# Patient Record
Sex: Female | Born: 1995 | Race: Black or African American | Hispanic: No | Marital: Single | Smoking: Never smoker
Health system: Southern US, Community
[De-identification: ages and names within clinical notes are randomized; demographics above are authoritative.]

## PROBLEM LIST (undated history)

## (undated) DIAGNOSIS — T7840XA Allergy, unspecified, initial encounter: Secondary | ICD-10-CM

## (undated) DIAGNOSIS — F419 Anxiety disorder, unspecified: Secondary | ICD-10-CM

## (undated) HISTORY — DX: Allergy, unspecified, initial encounter: T78.40XA

## (undated) HISTORY — PX: WISDOM TOOTH EXTRACTION: SHX21

## (undated) HISTORY — DX: Anxiety disorder, unspecified: F41.9

## (undated) HISTORY — PX: UTERINE FIBROID SURGERY: SHX826

## (undated) HISTORY — PX: TYMPANOSTOMY TUBE PLACEMENT: SHX32

---
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
## 2018-08-29 ENCOUNTER — Ambulatory Visit (HOSPITAL_COMMUNITY)
Admission: EM | Admit: 2018-08-29 | Discharge: 2018-08-29 | Disposition: A | Discharge status: Home / Self Care | Payer: PRIVATE HEALTH INSURANCE | Attending: Internal Medicine | Admitting: Internal Medicine

## 2018-08-29 ENCOUNTER — Encounter (HOSPITAL_COMMUNITY): Payer: Self-pay

## 2018-08-29 DIAGNOSIS — J019 Acute sinusitis, unspecified: Secondary | ICD-10-CM

## 2018-08-29 HISTORY — DX: Allergy, unspecified, initial encounter: T78.40XA

## 2018-08-29 MED ORDER — CETIRIZINE HCL 10 MG PO CAPS: 10.0000 mg | ORAL_CAPSULE | Freq: Every day | ORAL | 0 refills | Status: DC

## 2018-08-29 MED ORDER — NAPROXEN 500 MG PO TABS: 500.0000 mg | ORAL_TABLET | Freq: Two times a day (BID) | ORAL | 0 refills | Status: DC

## 2018-08-29 MED ORDER — AMOXICILLIN-POT CLAVULANATE 875-125 MG PO TABS: 1.0000 | ORAL_TABLET | Freq: Two times a day (BID) | ORAL | 0 refills | Status: AC

## 2018-08-29 MED ORDER — PSEUDOEPH-BROMPHEN-DM 30-2-10 MG/5ML PO SYRP: 5.0000 mL | ORAL_SOLUTION | Freq: Four times a day (QID) | ORAL | 0 refills | Status: DC | PRN

## 2018-08-29 NOTE — Discharge Instructions (Signed)
Please begin taking Augmentin twice daily for the next 10 days to treat sinus infection Please continue to take daily allergy pill, I have sent in generic Zyrtec for you Please use cough syrup provided to help with cough and congestion, this also has Sudafed in it  Please take Naprosyn twice daily with food on your stomach to help with chest discomfort  Please continue to monitor symptoms and follow-up if symptoms worsening, changing, not improving with above treatment or developing worsening shortness of breath, chest discomfort, difficulty breathing, fevers

## 2018-08-29 NOTE — ED Triage Notes (Signed)
Pt presents with congestion, coughing up green & yellow mucus, extremity swelling, chest pain, and sore throat.

## 2018-08-30 NOTE — ED Provider Notes (Signed)
[facility]    CSN: [id number] Arrival date & time: 08/29/18  1557     History   Chief Complaint Chief Complaint  Patient presents with  . Congestion  . Sore Throat  . Chest Pain    HPI Jamie Valenzuela is a 22 y.o. female history of allergic rhinitis,Patient is presenting with URI symptoms- congestion, cough, sore throat. Patient's main complaints are persistence of symptoms. Symptoms have been going on for over 1 month. Patient has tried Claritin, over-the-counter allergy medicine, with minimal relief. Denies fever, nausea, vomiting, diarrhea. Denies shortness of breath and chest pain.  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  States that whenever she comes down here she always has these issues.  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   HPI  Past Medical History:  Diagnosis Date  . Allergies     There are no active problems to display for this patient.   Past Surgical History:  Procedure Laterality Date  . TYMPANOSTOMY TUBE PLACEMENT    . UTERINE FIBROID SURGERY    . WISDOM TOOTH EXTRACTION      OB History   None      Home Medications    Prior to Admission medications   Medication Sig Start Date End Date Taking? Authorizing Provider  amoxicillin-clavulanate (AUGMENTIN) 875-125 MG tablet Take 1 tablet by mouth 2 (two) times daily for 10 days. 08/29/18 09/08/18  Montavius Subramaniam C, PA-C  brompheniramine-pseudoephedrine-DM 30-2-10 MG/5ML syrup Take 5 mLs by mouth 4 (four) times daily as needed. 08/29/18   Hendrix Yurkovich C, PA-C  Cetirizine HCl 10 MG CAPS Take 1 capsule (10 mg total) by mouth daily for 10 days. 08/29/18 09/08/18  Korri Ask C, PA-C  naproxen (NAPROSYN) 500 MG tablet Take 1 tablet (500 mg total) by mouth 2 (two) times daily. 08/29/18   Donie Moulton, Junius Creamer, PA-C     Family History History reviewed. No pertinent family history.  Social History Social History   Tobacco Use  . Smoking status: Not on file  Substance Use Topics  . Alcohol use: Not on file  . Drug use: Not on file     Allergies   Patient has no known allergies.   Review of Systems Review of Systems  Constitutional: Negative for activity change, appetite change, chills, fatigue and fever.  HENT: Positive for congestion, rhinorrhea, sinus pressure and sore throat. Negative for ear pain and trouble swallowing.   Eyes: Negative for discharge and redness.  Respiratory: Positive for cough. Negative for chest tightness and shortness of breath.   Cardiovascular: Negative for chest pain.  Gastrointestinal: Negative for abdominal pain, diarrhea, nausea and vomiting.  Musculoskeletal: Negative for myalgias.  Skin: Negative for rash.  Neurological: Negative for dizziness, light-headedness and headaches.     Physical Exam Triage Vital Signs ED Triage Vitals  Enc Vitals Group     BP 08/29/18 1639 (!) 106/58     Pulse Rate 08/29/18 1639 (!) 55     Resp 08/29/18 1639 18     Temp 08/29/18 1639 98.3 F (36.8 C)     Temp Source 08/29/18 1639 Oral     SpO2 08/29/18 1639 95 %     Weight --      Height --      Head Circumference --      Peak Flow --  Pain Score 08/29/18 1640 7     Pain Loc --      Pain Edu? --      Excl. in GC? --    No data found.  Updated Vital Signs BP (!) 106/58 (BP Location: Right Arm)   Pulse (!) 55   Temp 98.3 F (36.8 C) (Oral)   Resp 18   LMP 08/27/2018   SpO2 95%   Visual Acuity Right Eye Distance:   Left Eye Distance:   Bilateral Distance:    Right Eye Near:   Left Eye Near:    Bilateral Near:     Physical Exam  Constitutional: She appears well-developed and well-nourished. No distress.  HENT:  Head: Normocephalic and atraumatic.  Bilateral ears without tenderness to palpation of external auricle, tragus and mastoid, EAC's  without erythema or swelling, TM's with good bony landmarks and cone of light. Non erythematous.  Oral mucosa pink and moist, no tonsillar enlargement or exudate. Posterior pharynx patent and nonerythematous, no uvula deviation or swelling. Normal phonation.  Eyes: Conjunctivae are normal.  Neck: Neck supple.  Cardiovascular: Normal rate and regular rhythm.  No murmur heard. Pulmonary/Chest: Effort normal and breath sounds normal. No respiratory distress. She exhibits tenderness.  Breathing comfortably at rest, CTABL, no wheezing, rales or other adventitious sounds auscultated  Anterior chest tender to palpation  Abdominal: Soft. There is no tenderness.  Musculoskeletal: She exhibits no edema.  Neurological: She is alert.  Skin: Skin is warm and dry.  Psychiatric: She has a normal mood and affect.  Nursing note and vitals reviewed.    UC Treatments / Results  Labs (all labs ordered are listed, but only abnormal results are displayed) Labs Reviewed - No data to display  EKG None  Radiology No results found.  Procedures Procedures (including critical care time)  Medications Ordered in UC Medications - No data to display  Initial Impression / Assessment and Plan / UC Course  I have reviewed the triage vital signs and the nursing notes.  Pertinent labs & imaging results that were available during my care of the patient were reviewed by me and considered in my medical decision making (see chart for details).     Patient with URI symptoms for over 1 month, given length of symptoms we will treat for sinusitis with Augmentin, discussed continuing further symptomatic management including daily allergy pill, provided patient with cough syrup as this also has been bothering her.  Chest discomfort seems more muscular given tenderness, will treat with Naprosyn.Discussed strict return precautions. Patient verbalized understanding and is agreeable with plan.  Final Clinical  Impressions(s) / UC Diagnoses   Final diagnoses:  Acute sinusitis with symptoms > 10 days     Discharge Instructions     Please begin taking Augmentin twice daily for the next 10 days to treat sinus infection Please continue to take daily allergy pill, I have sent in generic Zyrtec for you Please use cough syrup provided to help with cough and congestion, this also has Sudafed in it  Please take Naprosyn twice daily with food on your stomach to help with chest discomfort  Please continue to monitor symptoms and follow-up if symptoms worsening, changing, not improving with above treatment or developing worsening shortness of breath, chest discomfort, difficulty breathing, fevers   ED Prescriptions    Medication Sig Dispense Auth. Provider   amoxicillin-clavulanate (AUGMENTIN) 875-125 MG tablet Take 1 tablet by mouth 2 (two) times daily for 10 days. 20 tablet Devansh Riese, [place] C,  PA-C   brompheniramine-pseudoephedrine-DM 30-2-10 MG/5ML syrup Take 5 mLs by mouth 4 (four) times daily as needed. 120 mL Valli Randol C, PA-C   Cetirizine HCl 10 MG CAPS Take 1 capsule (10 mg total) by mouth daily for 10 days. 10 capsule Jsiah Menta C, PA-C   naproxen (NAPROSYN) 500 MG tablet Take 1 tablet (500 mg total) by mouth 2 (two) times daily. 30 tablet April Colter, [place] C, PA-C     Controlled Substance Prescriptions [place] Controlled Substance Registry consulted? Not Applicable   Lew Dawes, [place] 08/30/18 1130

## 2019-01-07 ENCOUNTER — Ambulatory Visit: Payer: PRIVATE HEALTH INSURANCE | Attending: Pediatrics | Admitting: Physical Therapy

## 2019-01-07 ENCOUNTER — Encounter: Payer: Self-pay | Admitting: Physical Therapy

## 2019-01-07 DIAGNOSIS — M542 Cervicalgia: Secondary | ICD-10-CM | POA: Diagnosis not present

## 2019-01-07 DIAGNOSIS — M546 Pain in thoracic spine: Secondary | ICD-10-CM

## 2019-01-07 DIAGNOSIS — M6281 Muscle weakness (generalized): Secondary | ICD-10-CM | POA: Diagnosis not present
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
## 2019-01-19 ENCOUNTER — Ambulatory Visit: Payer: PRIVATE HEALTH INSURANCE | Admitting: Physical Therapy

## 2019-01-20 ENCOUNTER — Encounter: Payer: Self-pay | Admitting: Physical Therapy

## 2019-01-20 ENCOUNTER — Ambulatory Visit: Payer: PRIVATE HEALTH INSURANCE | Admitting: Physical Therapy

## 2019-01-20 DIAGNOSIS — M542 Cervicalgia: Secondary | ICD-10-CM

## 2019-01-20 DIAGNOSIS — M6281 Muscle weakness (generalized): Secondary | ICD-10-CM

## 2019-01-20 DIAGNOSIS — M546 Pain in thoracic spine: Secondary | ICD-10-CM

## 2019-01-20 NOTE — Therapy (Signed)
[facility] [address] [place], [place], [address] Phone: [phone]   Fax:  [phone]  Physical Therapy Treatment  Patient Details  Name: Jamie Valenzuela MRN: [id number] Date of Birth: [birth year] Referring Provider (PT): Herbert Pun MD   Encounter Date: 01/20/2019  PT End of Session - 01/20/19 1319    Visit Number  2    Number of Visits  8    Date for PT Re-Evaluation  03/08/19    PT Start Time  1017    PT Stop Time  1100    PT Time Calculation (min)  43 min    Activity Tolerance  Patient tolerated treatment well    Behavior During Therapy  [facility] for tasks assessed/performed       Past Medical History:  Diagnosis Date  . Allergies     Past Surgical History:  Procedure Laterality Date  . TYMPANOSTOMY TUBE PLACEMENT    . UTERINE FIBROID SURGERY    . WISDOM TOOTH EXTRACTION      There were no vitals filed for this visit.  Subjective Assessment - 01/20/19 1024    Subjective  Has been doing the exercises.   PPT makes discomfort mid back    Currently in Pain?  Yes    Pain Location  Back    Pain Orientation  Lower    Pain Descriptors / Indicators  Aching;Discomfort    Pain Type  Acute pain    Aggravating Factors   PPT, sitting ,  laying too long     Pain Relieving Factors  heating pad   warm shower                       [facility] Adult PT Treatment/Exercise - 01/20/19 0001      Self-Care   Self-Care  ADL's;Posture    ADL's  handout discussed some    Posture  sleeping posture,  sitting ,  school       Lumbar Exercises: Stretches   Passive Hamstring Stretch  3 reps;30 seconds    Passive Hamstring Stretch Limitations  sitting      Lumbar Exercises: Supine   Pelvic Tilt  5 reps    Pelvic Tilt Limitations  mod cues      Shoulder Exercises: Standing   Row  10 reps;Strengthening;Theraband    Theraband Level (Shoulder Row)  Level 3 (Green)    Row Limitations  moderate cues for technique,   needed scapular cues      Shoulder Exercises: Stretch   Other Shoulder Stretches  upper trap,  levator stretches min cues needed      Manual Therapy   Manual Therapy  Soft tissue mobilization    Soft tissue mobilization  right low back,  both upper trap,  instrumenyt assist at times   trigger point release left upper trap.               PT Education - 01/20/19 1319    Education Details  Self care,  Exercise form,  HEP    Person(s) Educated  Patient    Methods  Explanation;Demonstration;Tactile cues;Verbal cues;Handout    Comprehension  Returned demonstration;Verbalized understanding;Need further instruction          PT Long Term Goals - 01/07/19 1304      PT LONG TERM GOAL #1   Title  Pt will be independent in her HEP and progression.     Baseline  issued beginning HEP today    Time  4    Period  Weeks    Status  New    Target Date  02/04/19      PT LONG TERM GOAL #2   Title  Pt will be able to improve her FOTO score from 59% limitation to </= 49% limitiation.     Baseline  59% limitation at evaluation    Time  4    Period  Weeks    Status  New    Target Date  02/04/19      PT LONG TERM GOAL #3   Title  Pt will be able to improve her QL/ core strength to >/= 4+/5 in order to improve functional mobility.     Baseline  3+/5 QL    Time  4    Period  Weeks    Status  New    Target Date  02/04/19      PT LONG TERM GOAL #4   Title  Pt will report pain </= 3/10 after a full day of college activities and work.     Baseline  5/10 and can progress to 10/10.     Time  4    Period  Weeks    Status  New    Target Date  02/04/19            Plan - 01/20/19 1320    Clinical Impression Statement  Education , exercise  focus today.  Patient required moderate cues for HEP.  manual  today.  Patient declined the need for modalities for pain.     PT Next Visit Plan  core strengthening, STW to thoracic spine, UT/ levator stretching, HS stretching, continue postural  exercises and edu    PT Home Exercise Plan  posture correction, rows, shoulder retraction with ER using green theraband,  Hamstring stretch sitting.     Consulted and Agree with Plan of Care  Patient       Patient will benefit from skilled therapeutic intervention in order to improve the following deficits and impairments:     Visit Diagnosis: Acute midline thoracic back pain  Muscle weakness (generalized)  Cervicalgia     Problem List There are no active problems to display for this patient.   HARRIS,KAREN PTA 01/20/2019, 1:22 PM  [facility] [address] [place], [place], [address] Phone: [phone]   Fax:  [phone]  Name: Jamie Valenzuela MRN: [id number] Date of Birth: 05-08-1996

## 2019-01-20 NOTE — Patient Instructions (Addendum)
Leg Extension (Hamstring)    Sit toward front edge of chair, with leg out straight, heel on floor, toes pointing toward body. Keeping back straight, bend forward at hip,    Hold 30 seconds.  Return, br Repeat _3_ times. Repeat with other leg. Do __1_ sessions per day.   Copyright  VHI. All rights res Sleeping on Back  Place pillow under knees. A pillow with cervical support and a roll around waist are also helpful. Copyright  VHI. All rights reserved.  Sleeping on Side Place pillow between knees. Use cervical support under neck and a roll around waist as needed. Copyright  VHI. All rights reserved.   Sleeping on Stomach   If this is the only desirable sleeping position, place pillow under lower legs, and under stomach or chest as needed.  Posture - Sitting   Sit upright, head facing forward. Try using a roll to support lower back. Keep shoulders relaxed, and avoid rounded back. Keep hips level with knees. Avoid crossing legs for long periods. Stand to Sit / Sit to Stand   To sit: Bend knees to lower self onto front edge of chair, then scoot back on seat. To stand: Reverse sequence by placing one foot forward, and scoot to front of seat. Use rocking motion to stand up.   Work Height and Reach  Ideal work height is no more than 2 to 4 inches below elbow level when standing, and at elbow level when sitting. Reaching should be limited to arm's length, with elbows slightly bent.  Bending  Bend at hips and knees, not back. Keep feet shoulder-width apart.    Posture - Standing   Good posture is important. Avoid slouching and forward head thrust. Maintain curve in low back and align ears over shoul- ders, hips over ankles.  Alternating Positions   Alternate tasks and change positions frequently to reduce fatigue and muscle tension. Take rest breaks. Computer Work   Position work to Art gallery manager. Use proper work and seat height. Keep shoulders back and down, wrists  straight, and elbows at right angles. Use chair that provides full back support. Add footrest and lumbar roll as needed.  Getting Into / Out of Car  Lower self onto seat, scoot back, then bring in one leg at a time. Reverse sequence to get out.  Dressing  Lie on back to pull socks or slacks over feet, or sit and bend leg while keeping back straight.    Housework - Sink  Place one foot on ledge of cabinet under sink when standing at sink for prolonged periods.   Pushing / Pulling  Pushing is preferable to pulling. Keep back in proper alignment, and use leg muscles to do the work.  Deep Squat   Squat and lift with both arms held against upper trunk. Tighten stomach muscles without holding breath. Use smooth movements to avoid jerking.  Avoid Twisting   Avoid twisting or bending back. Pivot around using foot movements, and bend at knees if needed when reaching for articles.  Carrying Luggage   Distribute weight evenly on both sides. Use a cart whenever possible. Do not twist trunk. Move body as a unit.   Lifting Principles .Maintain proper posture and head alignment. .Slide object as close as possible before lifting. .Move obstacles out of the way. .Test before lifting; ask for help if too heavy. .Tighten stomach muscles without holding breath. .Use smooth movements; do not jerk. .Use legs to do the work, and pivot with feet. .Distribute  the work load symmetrically and close to the center of trunk. .Push instead of pull whenever possible.   Ask For Help   Ask for help and delegate to others when possible. Coordinate your movements when lifting together, and maintain the low back curve.  Log Roll   Lying on back, bend left knee and place left arm across chest. Roll all in one movement to the right. Reverse to roll to the left. Always move as one unit. Housework - Sweeping  Use long-handled equipment to avoid stooping.   Housework - Wiping  Position yourself  as close as possible to reach work surface. Avoid straining your back.  Laundry - Unloading Wash   To unload small items at bottom of washer, lift leg opposite to arm being used to reach.  Gardening - Raking  Move close to area to be raked. Use arm movements to do the work. Keep back straight and avoid twisting.     Cart  When reaching into cart with one arm, lift opposite leg to keep back straight.   Getting Into / Out of Bed  Lower self to lie down on one side by raising legs and lowering head at the same time. Use arms to assist moving without twisting. Bend both knees to roll onto back if desired. To sit up, start from lying on side, and use same move-ments in reverse. Housework - Vacuuming  Hold the vacuum with arm held at side. Step back and forth to move it, keeping head up. Avoid twisting.   Laundry - Armed forces training and education officer so that bending and twisting can be avoided.   Laundry - Unloading Dryer  Squat down to reach into clothes dryer or use a reacher.  Gardening - Weeding / Psychiatric nurse or Kneel. Knee pads may be helpful.

## 2019-01-31 ENCOUNTER — Ambulatory Visit: Payer: PRIVATE HEALTH INSURANCE | Admitting: Physical Therapy

## 2019-02-04 ENCOUNTER — Other Ambulatory Visit: Payer: Self-pay

## 2019-02-04 ENCOUNTER — Encounter: Payer: Self-pay | Admitting: Physical Therapy

## 2019-02-04 ENCOUNTER — Ambulatory Visit: Payer: PRIVATE HEALTH INSURANCE | Attending: Pediatrics | Admitting: Physical Therapy

## 2019-02-04 DIAGNOSIS — M542 Cervicalgia: Secondary | ICD-10-CM | POA: Insufficient documentation

## 2019-02-04 DIAGNOSIS — M546 Pain in thoracic spine: Secondary | ICD-10-CM

## 2019-02-04 DIAGNOSIS — M6281 Muscle weakness (generalized): Secondary | ICD-10-CM | POA: Insufficient documentation

## 2019-02-04 NOTE — Therapy (Addendum)
[place], [place], [address] Phone: [phone]   Fax:  [phone]  Physical Therapy Treatment Discharge  Patient Details  Name: Jamie Valenzuela MRN: [id number] Date of Birth: 1996-01-06 Referring Provider (PT): Carver Fila MD   Encounter Date: 02/04/2019  PT End of Session - 02/04/19 1155    Visit Number  3    Number of Visits  8    Date for PT Re-Evaluation  03/08/19    PT Start Time  0263    PT Stop Time  1225    PT Time Calculation (min)  39 min       Past Medical History:  Diagnosis Date  . Allergies     Past Surgical History:  Procedure Laterality Date  . TYMPANOSTOMY TUBE PLACEMENT    . UTERINE FIBROID SURGERY    . WISDOM TOOTH EXTRACTION      There were no vitals filed for this visit.  Subjective Assessment - 02/04/19 1148    Subjective  Slacked over spring break with the HEP. Feel more pain in my back. Really tired.     Currently in Pain?  Yes    Pain Score  4     Pain Location  Back    Pain Orientation  Lower;Left    Aggravating Factors   laying too long on back or side    Pain Relieving Factors  switching positions                        [facility] Adult PT Treatment/Exercise - 02/04/19 0001      Self-Care   Self-Care  Other Self-Care Comments    Posture  sleeping posture,  sitting ,  school     Other Self-Care Comments   Self massage/trigger point release to upper traps, periscap       Exercises   Exercises  Neck      Lumbar Exercises: Stretches   Active Hamstring Stretch  3 reps;30 seconds    Active Hamstring Stretch Limitations  supine     Single Knee to Chest Stretch  3 reps;30 seconds    Quadruped Mid Back Stretch  3 reps;30 seconds    Quadruped Mid Back Stretch Limitations  with laterals       Lumbar Exercises: Supine   Pelvic Tilt  20 reps      Lumbar Exercises: Quadruped   Madcat/Old Horse  10 reps      Shoulder Exercises: Standing   Extension  20 reps    Theraband Level (Shoulder Extension)  Level 3 (Green)    Row  20 reps    Theraband Level (Shoulder Row)  Level 3 (Green)      Neck Exercises: Stretches   Upper Trapezius Stretch  3 reps;20 seconds    Levator Stretch  3 reps;20 seconds             PT Education - 02/04/19 1221    Education Details  HEP    Person(s) Educated  Patient    Methods  Explanation;Handout    Comprehension  Verbalized understanding          PT Long Term Goals - 01/07/19 1304      PT LONG TERM GOAL #1   Title  Pt will be independent in her HEP and progression.     Baseline  issued beginning HEP today    Time  4    Period  Weeks    Status  New    Target Date  02/04/19      PT LONG TERM GOAL #2   Title  Pt will be able to improve her FOTO score from 59% limitation to </= 49% limitiation.     Baseline  59% limitation at evaluation    Time  4    Period  Weeks    Status  New    Target Date  02/04/19      PT LONG TERM GOAL #3   Title  Pt will be able to improve her QL/ core strength to >/= 4+/5 in order to improve functional mobility.     Baseline  3+/5 QL    Time  4    Period  Weeks    Status  New    Target Date  02/04/19      PT LONG TERM GOAL #4   Title  Pt will report pain </= 3/10 after a full day of college activities and work.     Baseline  5/10 and can progress to 10/10.     Time  4    Period  Weeks    Status  New    Target Date  02/04/19            Plan - 02/04/19 1203    Clinical Impression Statement  Pt reports low compliance with HEP. Progressed with stretching and strengthening. Reviewed therabands as she had questions. She notes less thoracic pain however does have tension in upper traps. Tennis ball instruction for self TPR to periscap and UT. Shown theracane as an option. Pt reported less pain at end of session. She is currently not scheduled for future appointments due to College closing. She will likeyl retrun to [place] during closure. Asked  her to call and resume PT within 30  days or get a new referral.     PT Next Visit Plan  core strengthening,  UT/ levator stretching, HS stretching, continue postural exercises and edu    PT Home Exercise Plan  posture correction, rows, shoulder retraction with ER using green theraband,  Hamstring stretch sitting.        Patient will benefit from skilled therapeutic intervention in order to improve the following deficits and impairments:  Pain, Postural dysfunction, Decreased activity tolerance, Decreased strength, Decreased range of motion, Impaired flexibility  Visit Diagnosis: Acute midline thoracic back pain  Muscle weakness (generalized)  Cervicalgia  PHYSICAL THERAPY DISCHARGE SUMMARY  Visits from Start of Care: 3  Current functional level related to goals / functional outcomes: See above   Remaining deficits: See above   Education / Equipment: HEP Plan: Patient agrees to discharge.  Patient goals were not met. Patient is being discharged due to not returning since the last visit.  ?????     Kearney Hard, PT 07/26/19 9:27 AM      Problem List There are no active problems to display for this patient.   Dorene Ar, [place] 02/04/2019, 12:43 PM  [facility] [address] [place], [place], [address] Phone: [phone]   Fax:  [phone]  Name: Lillyan Hitson MRN: [id number] Date of Birth: 21-Mar-1996

## 2019-07-16 ENCOUNTER — Other Ambulatory Visit: Payer: Self-pay

## 2019-07-16 DIAGNOSIS — Z20822 Contact with and (suspected) exposure to covid-19: Secondary | ICD-10-CM

## 2019-07-17 LAB — NOVEL CORONAVIRUS, NAA: SARS-CoV-2, NAA: NOT DETECTED

## 2020-03-01 ENCOUNTER — Ambulatory Visit: Payer: PRIVATE HEALTH INSURANCE | Attending: Family

## 2020-03-01 DIAGNOSIS — Z23 Encounter for immunization: Secondary | ICD-10-CM

## 2020-03-01 NOTE — Progress Notes (Signed)
   Covid-19 Vaccination Clinic  Name:  Jamie Valenzuela    MRN: [id number] DOB: 11/27/1995  03/01/2020  [name] was observed post Covid-19 immunization for 15 minutes without incident. She was provided with Vaccine Information Sheet and instruction to access the V-Safe system.   [name] was instructed to call 911 with any severe reactions post vaccine: Marland Kitchen Difficulty breathing  . Swelling of face and throat  . A fast heartbeat  . A bad rash all over body  . Dizziness and weakness   Immunizations Administered    Name Date Dose VIS Date Route   Moderna COVID-19 Vaccine 03/01/2020 12:11 PM 0.5 mL 10/25/2019 Intramuscular   Manufacturer: Moderna   Lot: [id number]   NDC: 09295-747-34

## 2020-03-27 ENCOUNTER — Ambulatory Visit: Payer: PRIVATE HEALTH INSURANCE | Attending: Family

## 2020-03-27 DIAGNOSIS — Z23 Encounter for immunization: Secondary | ICD-10-CM

## 2020-03-27 NOTE — Progress Notes (Signed)
   Covid-19 Vaccination Clinic  Name:  Jamie Valenzuela    MRN: [id number] DOB: [birth year]  03/27/2020  Ms. Herrera was observed post Covid-19 immunization for 15 minutes without incident. She was provided with Vaccine Information Sheet and instruction to access the V-Safe system.   Ms. Sherr was instructed to call 911 with any severe reactions post vaccine: Marland Kitchen Difficulty breathing  . Swelling of face and throat  . A fast heartbeat  . A bad rash all over body  . Dizziness and weakness   Immunizations Administered    Name Date Dose VIS Date Route   Moderna COVID-19 Vaccine 03/27/2020  2:17 PM 0.5 mL 10/2019 Intramuscular   Manufacturer: Moderna   Lot: [id number]   NDC: 59563-875-64

## 2020-03-28 ENCOUNTER — Ambulatory Visit: Payer: PRIVATE HEALTH INSURANCE | Attending: Physical Therapy | Admitting: Physical Therapy

## 2020-07-11 ENCOUNTER — Encounter: Payer: Self-pay | Admitting: Physical Therapy

## 2020-07-11 ENCOUNTER — Ambulatory Visit: Payer: 59 | Attending: Orthopaedic Surgery | Admitting: Physical Therapy

## 2020-07-11 ENCOUNTER — Other Ambulatory Visit: Payer: Self-pay

## 2020-07-11 DIAGNOSIS — R293 Abnormal posture: Secondary | ICD-10-CM | POA: Diagnosis present

## 2020-07-11 DIAGNOSIS — G8929 Other chronic pain: Secondary | ICD-10-CM | POA: Diagnosis present

## 2020-07-11 DIAGNOSIS — M545 Low back pain, unspecified: Secondary | ICD-10-CM

## 2020-07-11 DIAGNOSIS — M546 Pain in thoracic spine: Secondary | ICD-10-CM | POA: Diagnosis present
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
## 2020-07-20 ENCOUNTER — Ambulatory Visit: Payer: 59

## 2020-07-23 ENCOUNTER — Ambulatory Visit: Payer: 59 | Admitting: Physical Therapy

## 2020-07-27 ENCOUNTER — Telehealth: Payer: Self-pay | Admitting: Physical Therapy

## 2020-07-27 ENCOUNTER — Ambulatory Visit: Payer: PRIVATE HEALTH INSURANCE | Admitting: Physical Therapy

## 2020-07-27 NOTE — Telephone Encounter (Signed)
Attempted to call regarding no show for therapy appointment-no answer and unable to leave message-voicemail not set up.

## 2020-08-03 ENCOUNTER — Ambulatory Visit: Payer: PRIVATE HEALTH INSURANCE | Admitting: Physical Therapy

## 2020-08-06 ENCOUNTER — Encounter: Payer: PRIVATE HEALTH INSURANCE | Admitting: Physical Therapy

## 2020-08-10 ENCOUNTER — Encounter: Payer: PRIVATE HEALTH INSURANCE | Admitting: Physical Therapy

## 2020-08-13 ENCOUNTER — Encounter: Payer: PRIVATE HEALTH INSURANCE | Admitting: Physical Therapy

## 2020-08-17 ENCOUNTER — Encounter: Payer: PRIVATE HEALTH INSURANCE | Admitting: Physical Therapy

## 2020-08-20 ENCOUNTER — Encounter: Payer: PRIVATE HEALTH INSURANCE | Admitting: Physical Therapy

## 2020-08-24 ENCOUNTER — Encounter: Payer: PRIVATE HEALTH INSURANCE | Admitting: Physical Therapy

## 2020-10-16 NOTE — Therapy (Signed)
[place], [place], [address] Phone: [phone]   Fax:  [phone]  Physical Therapy Evaluation/Discharge  Patient Details  Name: Jamie Valenzuela MRN: [id number] Date of Birth: [birth year] Referring Provider (PT): Hassan Rowan, MD   Encounter Date: 07/11/2020    Past Medical History:  Diagnosis Date  . Allergies   . Anxiety     Past Surgical History:  Procedure Laterality Date  . TYMPANOSTOMY TUBE PLACEMENT    . UTERINE FIBROID SURGERY    . WISDOM TOOTH EXTRACTION      There were no vitals filed for this visit.                    Objective measurements completed on examination: See above findings.                    PT Long Term Goals - 07/11/20 1319      PT LONG TERM GOAL #1   Title Independent with HEP    Baseline instructed today    Time 6    Period Weeks    Status New    Target Date 08/22/20      PT LONG TERM GOAL #2   Title Improve FOTO outcome measure score to 28% or less impairment    Baseline 35% limited    Time 6    Period Weeks    Status New    Target Date 08/22/20      PT LONG TERM GOAL #3   Title Tolerate supine positioning for sleep with back pain symptoms decreased at least 40% from baseline status    Baseline difficulty tolerating supine positioning    Time 6    Period Weeks    Status New    Target Date 08/22/20      PT LONG TERM GOAL #4   Title Increase right cervical rotation AROM at least 10 deg to improve ability to turn head while driving    Baseline 59 deg    Time 6    Period Weeks    Status New    Target Date 08/22/20      PT LONG TERM GOAL #5   Title Tolerate sitting for college classes/doing schoolwork for periods of up to at least an hour with back pain 2/10 or less    Baseline 4/10 pain today increased with prolonged sitting    Time 6    Period Weeks    Status New    Target Date 08/22/20                    Patient will benefit from skilled therapeutic intervention in order to improve the following deficits and impairments:  Pain, Postural dysfunction, Impaired flexibility, Decreased strength, Decreased activity tolerance, Increased muscle spasms  Visit Diagnosis: Pain in thoracic spine - Plan: PT plan of care cert/re-cert  Chronic low back pain without sciatica, unspecified back pain laterality - Plan: PT plan of care cert/re-cert  Abnormal posture - Plan: PT plan of care cert/re-cert     Problem List There are no problems to display for this patient.       PHYSICAL THERAPY DISCHARGE SUMMARY  Visits from Start of Care: 1  Current functional level related to goals / functional outcomes: Patient did not return after initial evaluation visit 07/11/20 with no show for follow up visit. Current status unknown.   Remaining deficits: Status unknown   Education / Equipment: HEP  Plan:  Patient agrees to discharge.  Patient goals were not met. Patient is being discharged due to not returning since the last visit.  ?????           Beaulah Dinning, PT, DPT 10/16/20 11:34 AM       [facility] [address] [place], [place], [address] Phone: [phone]   Fax:  [phone]  Name: Jamie Valenzuela MRN: [id number] Date of Birth: 24-Feb-1996

## 2020-12-22 ENCOUNTER — Emergency Department (HOSPITAL_COMMUNITY): Payer: Medicaid - Out of State

## 2020-12-22 ENCOUNTER — Other Ambulatory Visit: Payer: Self-pay

## 2020-12-22 ENCOUNTER — Emergency Department (HOSPITAL_COMMUNITY)
Admission: EM | Admit: 2020-12-22 | Discharge: 2020-12-22 | Disposition: A | Discharge status: Home / Self Care | Payer: Medicaid - Out of State | Attending: Emergency Medicine | Admitting: Emergency Medicine

## 2020-12-22 ENCOUNTER — Encounter (HOSPITAL_COMMUNITY): Payer: Self-pay | Admitting: Emergency Medicine

## 2020-12-22 DIAGNOSIS — Y92410 Unspecified street and highway as the place of occurrence of the external cause: Secondary | ICD-10-CM | POA: Diagnosis not present

## 2020-12-22 DIAGNOSIS — S66912A Strain of unspecified muscle, fascia and tendon at wrist and hand level, left hand, initial encounter: Secondary | ICD-10-CM | POA: Diagnosis not present

## 2020-12-22 DIAGNOSIS — S6992XA Unspecified injury of left wrist, hand and finger(s), initial encounter: Secondary | ICD-10-CM | POA: Diagnosis present

## 2020-12-22 MED ORDER — METHOCARBAMOL 500 MG PO TABS: 500.0000 mg | ORAL_TABLET | Freq: Two times a day (BID) | ORAL | 0 refills | Status: DC
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
## 2021-01-25 ENCOUNTER — Inpatient Hospital Stay (INDEPENDENT_AMBULATORY_CARE_PROVIDER_SITE_OTHER): Payer: Commercial Managed Care - HMO | Admitting: Primary Care

## 2021-02-04 ENCOUNTER — Ambulatory Visit (INDEPENDENT_AMBULATORY_CARE_PROVIDER_SITE_OTHER): Payer: Commercial Managed Care - PPO | Admitting: Primary Care

## 2021-02-04 ENCOUNTER — Encounter (INDEPENDENT_AMBULATORY_CARE_PROVIDER_SITE_OTHER): Payer: Self-pay | Admitting: Primary Care

## 2021-02-04 ENCOUNTER — Other Ambulatory Visit: Payer: Self-pay

## 2021-02-04 VITALS — BP 111/71 | HR 89 | Temp 100.2°F | Ht 64.0 in | Wt 121.2 lb

## 2021-02-04 DIAGNOSIS — Z7689 Persons encountering health services in other specified circumstances: Secondary | ICD-10-CM

## 2021-02-04 DIAGNOSIS — M79602 Pain in left arm: Secondary | ICD-10-CM | POA: Diagnosis not present

## 2021-02-04 DIAGNOSIS — Z3009 Encounter for other general counseling and advice on contraception: Secondary | ICD-10-CM

## 2021-02-04 DIAGNOSIS — L731 Pseudofolliculitis barbae: Secondary | ICD-10-CM

## 2021-02-04 NOTE — Patient Instructions (Addendum)
Ingrown Hair  An ingrown hair is a hair that curls and re-enters the skin instead of growing straight out of the skin. An ingrown hair can develop in any part of the skin that hair is removed from. An ingrown hair may cause small pockets of infection. What are the causes? An ingrown hair may be caused by:  Shaving.  Tweezing.  Waxing.  Using a hair removal cream. What increases the risk? You are more likely to develop this condition if you have curly hair. What are the signs or symptoms? Symptoms of an ingrown hair may include:  Small bumps on the skin. The bumps may be filled with pus.  Pain.  Itching. How is this diagnosed? An ingrown hair is diagnosed by a skin exam. How is this treated? Treatment is often not needed unless the ingrown hair has caused an infection. If needed, treatment may include:  Applying prescription creams to the skin. This can help with inflammation.  Applying warm compresses to the skin. This can help soften the skin.  Taking antibiotic medicine. An antibiotic may be prescribed if the infection is severe.  Retracting and releasing the ingrown hair tips. Follow these instructions at home: Medicines  Take, apply, or use over-the-counter and prescription medicines only as told by your health care provider. This includes any prescription creams.  If you were prescribed an antibiotic medicine, take it as told by your health care provider. Do not stop using the antibiotic even if you start to feel better. General instructions  Do not shave irritated areas of skin. You may start shaving these areas again once the irritation has gone away.  To help remove ingrown hairs on your face, you may use a facial sponge in a gentle circular motion.  Do not pick or squeeze the irritated area of skin as this may cause infection and scarring. Managing pain and swelling  If directed, apply heat to the affected area as often as told by your health care provider.  Use the heat source that your health care provider recommends, such as a moist heat pack or a heating pad. ? Place a towel between your skin and the heat source. ? Leave the heat on for 20-30 minutes. ? Remove the heat if your skin turns bright red. This is especially important if you are unable to feel pain, heat, or cold. You may have a greater risk of getting burned.   How is this prevented?  Shower before shaving.  Wrap areas that you are going to shave in warm, moist wraps for several minutes before shaving. The warmth and moisture help to soften the hairs and makes ingrown hairs less likely.  Use thick shaving gels.  Use a razor that cuts hair slightly above your skin, or use an electric shaver with a long shave setting.  Shave in the direction of hair growth.  Avoid making multiple razor strokes.  Apply a moisturizing lotion after shaving. Summary  An ingrown hair is a hair that curls and re-enters the skin instead of growing straight out of the skin.  Treatment is often not needed unless the ingrown hair has caused an infection.  Take, apply, or use over-the-counter and prescription medicines only as told by your health care provider. This includes any prescription creams.  Do not shave irritated areas of skin. You may start shaving these areas again once the irritation has gone away.  If directed, apply heat to the affected area. Use the heat source that your health care  provider recommends, such as a moist heat pack or a heating pad. This information is not intended to replace advice given to you by your health care provider. Make sure you discuss any questions you have with your health care provider. Document Revised: 05/19/2018 Document Reviewed: 05/19/2018 Elsevier Patient Education  2021 Elsevier Inc.  Contraception Choices Contraception refers to things you do or use to prevent pregnancy. It is also called birth control. There are several methods of birth control.  Talk to your doctor about the best method for you. Hormonal birth control This kind of birth control uses hormones. Here are some types of hormonal birth control:  A tube that is put under the skin of your arm (implant). The tube can stay in for up to 3 years.  Shots you get every 3 months.  Pills you take every day.  A patch you change 1 time each week for 3 weeks. After that, the patch is taken off for 1 week.  A ring you put in the vagina. The ring is left in for 3 weeks. Then it is taken out of the vagina for 1 week. Then a new ring is put in.  Pills you take after unprotected sex. These are called emergency birth control pills.   Barrier birth control Here are some types of barrier birth control:  A thin covering that is put on the penis before sex (female condom). The covering is thrown away after sex.  A soft, loose covering that is put in the vagina before sex (female condom). The covering is thrown away after sex.  A rubber bowl that sits over the cervix (diaphragm). The bowl must be made for you. The bowl is put into the vagina before sex. The bowl is left in for 6-8 hours after sex. It is taken out within 24 hours.  A small, soft cup that fits over the cervix (cervical cap). The cup must be made for you. The cup should be left in for 6-8 hours after sex. It is taken out within 48 hours.  A sponge that is put into the vagina before sex. It must be left in for at least 6 hours after sex. It must be taken out within 30 hours and thrown away.  A chemical that kills or stops sperm from getting into the womb (uterus). This chemical is called a spermicide. It may be a pill, cream, jelly, or foam to put in the vagina. The chemical should be used at least 10-15 minutes before sex.   IUD birth control IUD means "intrauterine device." It is put inside the womb. There are two kinds:  Hormone IUD. This kind can stay in the womb for 3-5 years.  Copper IUD. This kind can stay in the womb  for 10 years. Permanent birth control Here are some types of permanent birth control:  Surgery to block the fallopian tubes.  Having an insert put into each fallopian tube. This method takes 3 months to work. Other forms of birth control must be used for 3 months.  Surgery to tie off the tubes that carry sperm in men (vasectomy). This method takes 3 months to work. Other forms of birth control must be used for 3 months. Natural planning birth control Here are some types of natural planning birth control:  Not having sex on the days the woman could get pregnant.  Using a calendar: ? To keep track of the length of each menstrual cycle. ? To find out what days pregnancy  can happen. ? To plan to not have sex on days when pregnancy can happen.  Watching for signs of ovulation and not having sex during this time. One way the woman can check for ovulation is to check her temperature.  Waiting to have sex until after ovulation. Where to find more information  Centers for Disease Control and Prevention: [employer] Summary  Contraception, also called birth control, refers to things you do or use to prevent pregnancy.  Hormonal methods of birth control include implants, injections, pills, patches, vaginal rings, and emergency birth control pills.  Barrier methods of birth control can include female condoms, female condoms, diaphragms, cervical caps, sponges, and spermicides.  There are two types of IUD (intrauterine device) birth control. An IUD can be put in a woman's womb to prevent pregnancy for several years.  Permanent birth control can be done through a procedure for males, females, or both. Natural planning means not having sex when the woman could get pregnant. This information is not intended to replace advice given to you by your health care provider. Make sure you discuss any questions you have with your health care provider. Document Revised: 04/16/2020 Document Reviewed:  04/16/2020 Elsevier Patient Education  2021 [employer].
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
## 2021-02-06 ENCOUNTER — Ambulatory Visit: Payer: Medicaid - Out of State | Admitting: Physician Assistant

## 2021-02-07 ENCOUNTER — Ambulatory Visit: Payer: Self-pay

## 2021-02-07 ENCOUNTER — Telehealth: Payer: Self-pay | Admitting: Orthopaedic Surgery

## 2021-02-07 ENCOUNTER — Other Ambulatory Visit: Payer: Self-pay

## 2021-02-07 ENCOUNTER — Encounter: Payer: Self-pay | Admitting: Physician Assistant

## 2021-02-07 ENCOUNTER — Ambulatory Visit (INDEPENDENT_AMBULATORY_CARE_PROVIDER_SITE_OTHER): Payer: Commercial Managed Care - PPO | Admitting: Orthopaedic Surgery

## 2021-02-07 DIAGNOSIS — M25532 Pain in left wrist: Secondary | ICD-10-CM | POA: Diagnosis not present

## 2021-02-07 MED ORDER — MELOXICAM 7.5 MG PO TABS: 7.5000 mg | ORAL_TABLET | Freq: Every day | ORAL | 2 refills | Status: DC | PRN

## 2021-02-07 MED ORDER — PREDNISONE 10 MG (21) PO TBPK: ORAL_TABLET | ORAL | 0 refills | Status: DC
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
## 2021-02-07 NOTE — Telephone Encounter (Signed)
Rx sent to correct pharm 

## 2021-02-07 NOTE — Telephone Encounter (Signed)
Pt states that she was suppose to have prescriptions sent to [facility] on [address]. If [place] could please send those when you get a moment.

## 2021-02-19 ENCOUNTER — Telehealth: Payer: Self-pay | Admitting: Orthopaedic Surgery

## 2021-02-19 NOTE — Telephone Encounter (Signed)
Pt will need her out of work note updated and extended until her next appt on 03/15/21. Her job needs the work note to state it's because of her wrist she is not able to come into work; and due to her still not being able to type she would like 2 copies so she can take one to school. Pt would like a CB when this ready for pickup.   [phone]

## 2021-02-20 NOTE — Telephone Encounter (Signed)
Okay to do note?

## 2021-02-20 NOTE — Telephone Encounter (Signed)
She would also like a school note.    Please advise. Any restrictions? What should  note say?

## 2021-02-20 NOTE — Telephone Encounter (Signed)
Note made.  

## 2021-02-20 NOTE — Telephone Encounter (Signed)
syes

## 2021-02-21 ENCOUNTER — Telehealth: Payer: Self-pay

## 2021-02-21 NOTE — Telephone Encounter (Signed)
Ok, thanks.  Marisue Ivan, see above restirctions

## 2021-02-21 NOTE — Telephone Encounter (Signed)
Sent mychart msg.

## 2021-02-21 NOTE — Telephone Encounter (Signed)
No lifting more than 20 lbs.  No repetitive activities until f/u appt

## 2021-02-21 NOTE — Telephone Encounter (Signed)
Patient aware.

## 2021-02-21 NOTE — Telephone Encounter (Signed)
Looks like just a sprain.  Any restrictions from your standpoint?

## 2021-02-21 NOTE — Telephone Encounter (Signed)
Patient came into the office she has questions about rx that was sent to the pharmacy for prednisone and meloxicam patient hasnt taken nor picked up rx yet call back:[phone]

## 2021-02-22 NOTE — Telephone Encounter (Signed)
Stop taking ibuprofen.  Do not take any other nsaids while on steroid taper.  May then resume one nsaid.    Steroid has many different side effects that affect people differently.  Best to ready side effect paper given with rx

## 2021-02-26 ENCOUNTER — Telehealth: Payer: Self-pay | Admitting: Orthopaedic Surgery

## 2021-02-26 NOTE — Telephone Encounter (Signed)
Patient called. She needs a new work note. The note given had a date of 01/11/2021  not 02/08/2021. She would like the work note put in her mychart and emailed to her [email or web address]. her call back number is [phone]

## 2021-02-27 NOTE — Telephone Encounter (Signed)
She is OOW until next appt  03/15/2021.  I sent her mychart msg
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
## 2021-03-15 ENCOUNTER — Ambulatory Visit (INDEPENDENT_AMBULATORY_CARE_PROVIDER_SITE_OTHER): Payer: Commercial Managed Care - PPO | Admitting: Orthopaedic Surgery

## 2021-03-15 ENCOUNTER — Encounter: Payer: Self-pay | Admitting: Orthopaedic Surgery

## 2021-03-15 VITALS — Ht 64.0 in | Wt 121.0 lb

## 2021-03-15 DIAGNOSIS — M25532 Pain in left wrist: Secondary | ICD-10-CM | POA: Diagnosis not present
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
## 2021-04-25 ENCOUNTER — Telehealth: Payer: Self-pay | Admitting: Physical Medicine and Rehabilitation

## 2021-04-25 NOTE — Telephone Encounter (Signed)
Called pt and and couldn't leave message.

## 2021-04-25 NOTE — Telephone Encounter (Signed)
Patient called and need to reschedule her appt. Please call patient at [phone].

## 2021-04-25 NOTE — Telephone Encounter (Signed)
Called pt and LVM #1 

## 2021-04-26 ENCOUNTER — Encounter: Payer: Commercial Managed Care - PPO | Admitting: Physical Medicine and Rehabilitation

## 2021-04-29 ENCOUNTER — Encounter: Payer: Self-pay | Admitting: Orthopaedic Surgery

## 2021-04-29 NOTE — Telephone Encounter (Signed)
Yes out of work until f/u appt with me.  Thanks.

## 2021-06-28 ENCOUNTER — Encounter: Payer: Commercial Managed Care - PPO | Admitting: Physical Medicine and Rehabilitation

## 2021-10-15 ENCOUNTER — Ambulatory Visit (INDEPENDENT_AMBULATORY_CARE_PROVIDER_SITE_OTHER): Payer: Self-pay

## 2021-10-15 ENCOUNTER — Ambulatory Visit (INDEPENDENT_AMBULATORY_CARE_PROVIDER_SITE_OTHER): Payer: Commercial Managed Care - PPO | Admitting: Primary Care

## 2021-10-15 ENCOUNTER — Encounter (INDEPENDENT_AMBULATORY_CARE_PROVIDER_SITE_OTHER): Payer: Self-pay | Admitting: Primary Care

## 2021-10-15 ENCOUNTER — Other Ambulatory Visit: Payer: Self-pay

## 2021-10-15 VITALS — BP 104/59 | HR 67 | Temp 97.5°F | Ht 64.0 in | Wt 126.2 lb

## 2021-10-15 DIAGNOSIS — L732 Hidradenitis suppurativa: Secondary | ICD-10-CM | POA: Diagnosis not present

## 2021-10-15 DIAGNOSIS — L309 Dermatitis, unspecified: Secondary | ICD-10-CM | POA: Diagnosis not present

## 2021-10-15 DIAGNOSIS — R1030 Lower abdominal pain, unspecified: Secondary | ICD-10-CM | POA: Diagnosis not present

## 2021-10-15 MED ORDER — CETIRIZINE HCL 10 MG PO CAPS: 10.0000 mg | ORAL_CAPSULE | Freq: Every day | ORAL | 11 refills | Status: DC

## 2021-10-15 MED ORDER — TRIAMCINOLONE ACETONIDE 0.1 % EX CREA: 1.0000 "application " | TOPICAL_CREAM | Freq: Two times a day (BID) | CUTANEOUS | 1 refills | Status: DC

## 2021-10-15 NOTE — Progress Notes (Signed)
Pt complains of abdominal pain for the past few days. Denies pain today states last night pain level was a 5

## 2021-10-15 NOTE — Telephone Encounter (Signed)
PT calling stating that she has been experiencing abdominal pain (level 5) with some nausea x2-3 days. She states that she is also having back pain and some bloating. Please advise.   Pt. Reports 3 days ago she started having lower abdominal pain and bloating. Mild nausea. Hurts into back. No pain at present. Pain comes and goes. No fever or blood in stool. No vomiting or diarrhea. Appointment made for next week, but pt. Asking to be seen sooner. Please advise.    Reason for Disposition  [1] MILD pain (e.g., does not interfere with normal activities) AND [2] pain comes and goes (cramps) AND [3] present > 48 hours  (Exception: this same abdominal pain is a chronic symptom recurrent or ongoing AND present > 4 weeks)  Answer Assessment - Initial Assessment Questions 1. LOCATION: "Where does it hurt?"      Lower 2. RADIATION: "Does the pain shoot anywhere else?" (e.g., chest, back)     Back 3. ONSET: "When did the pain begin?" (e.g., minutes, hours or days ago)      3 days 4. SUDDEN: "Gradual or sudden onset?"     Gradual 5. PATTERN "Does the pain come and go, or is it constant?"    - If constant: "Is it getting better, staying the same, or worsening?"      (Note: Constant means the pain never goes away completely; most serious pain is constant and it progresses)     - If intermittent: "How long does it last?" "Do you have pain now?"     (Note: Intermittent means the pain goes away completely between bouts)     Comes and goes 6. SEVERITY: "How bad is the pain?"  (e.g., Scale 1-10; mild, moderate, or severe)   - MILD (1-3): doesn't interfere with normal activities, abdomen soft and not tender to touch    - MODERATE (4-7): interferes with normal activities or awakens from sleep, abdomen tender to touch    - SEVERE (8-10): excruciating pain, doubled over, unable to do any normal activities      Now - 0-1 7. RECURRENT SYMPTOM: "Have you ever had this type of stomach pain before?" If Yes, ask:  "When was the last time?" and "What happened that time?"      No 8. CAUSE: "What do you think is causing the stomach pain?"     Unsure 9. RELIEVING/AGGRAVATING FACTORS: "What makes it better or worse?" (e.g., movement, antacids, bowel movement)     No 10. OTHER SYMPTOMS: "Do you have any other symptoms?" (e.g., back pain, diarrhea, fever, urination pain, vomiting)       Bloating 11. PREGNANCY: "Is there any chance you are pregnant?" "When was your last menstrual period?"       No  Protocols used: Abdominal Pain - [facility]

## 2021-10-15 NOTE — Progress Notes (Signed)
[facility]   Subjective:   Jamie Valenzuela is a 25 y.o. female Patient presents today for abdominal pain. The pain is described as aching, burning, cramping, pressure-like, sharp, shooting, and stabbing, and is 5/10 in intensity. Pain is located in the suprapubic with radiation to back. Onset was 2 days ago. Symptoms have been gradually worsening since. Aggravating factors: activity and fatty foods.  Alleviating factors: none. Associated symptoms: none. The patient denies anorexia, arthralgias, belching, chills, constipation, diarrhea, dysuria, fever, flatus, headache, and hematuria.  She also voices rashes and bumps pruritus.  Symptoms Nausea/Vomiting: no  Diarrhea: no  Constipation: no  Melena/BRBPR: no  Hematemesis: no  Anorexia: no  Fever/Chills: no  Dysuria: no  Rash: no  Wt loss: no  EtOH use: no  NSAIDs/ASA: yes  LMP: Patient's last menstrual period was 10/07/2021 (exact date).  Vaginal bleeding: no  STD risk/hx: no   Past Medical History:  Diagnosis Date   Allergies    Anxiety    Past Surgical History:  Procedure Laterality Date   TYMPANOSTOMY TUBE PLACEMENT     UTERINE FIBROID SURGERY     WISDOM TOOTH EXTRACTION     Allergies  Allergen Reactions   Latex Hives   Avocado Hives   Current Outpatient Medications on File Prior to Visit  Medication Sig Dispense Refill   acetaminophen (TYLENOL) 650 MG CR tablet Take 650 mg by mouth every 8 (eight) hours as needed for pain.     ibuprofen (ADVIL) 800 MG tablet Take 800 mg by mouth every 8 (eight) hours as needed.     No current facility-administered medications on file prior to visit.   Social History   Socioeconomic History   Marital status: Single    Spouse name: Not on file   Number of children: Not on file   Years of education: Not on file   Highest education level: Not on file  Occupational History   Not on file  Tobacco Use   Smoking status: Never   Smokeless tobacco: Never  Substance  and Sexual Activity   Alcohol use: Not Currently   Drug use: Never   Sexual activity: Yes    Partners: Male  Other Topics Concern   Not on file  Social History Narrative   Not on file   Social Determinants of Health   Financial Resource Strain: Not on file  Food Insecurity: Not on file  Transportation Needs: Not on file  Physical Activity: Not on file  Stress: Not on file  Social Connections: Not on file  Intimate Partner Violence: Not on file   History reviewed. No pertinent family history.  OBJECTIVE:  Vitals:   10/15/21 1037  BP: (!) 104/59  Pulse: 67  Temp: (!) 97.5 F (36.4 C)  TempSrc: Temporal  SpO2: 93%  Weight: 126 lb 3.2 oz (57.2 kg)  Height: 5\' 4"  (1.626 m)    ROS Comprehensive review of systems positive and negative noted in HPI  ASSESSMENT & PLAN: Jamie Valenzuela was seen today for abdominal pain.  Diagnoses and all orders for this visit:  Lower abdominal pain Discussed eating small frequent meal, reduction in acidic foods, fried foods ,spicy foods, alcohol caffeine and tobacco and certain medications. Avoid laying down after eating 77mins-1hour, elevated head of the bed.   Eczema, unspecified type  Kenalog prescribed 1% twice daily keep areas clean and dry and use antibacterial soap and antihistamine  Hidradenitis suppurativa Information provided on AVS discussed and reviewed different types and images shown  other  orders -     Cetirizine HCl 10 MG CAPS; Take 1 capsule (10 mg total) by mouth daily for 10 days. -     Discontinue: triamcinolone cream (KENALOG) 0.1 %; Apply 1 application topically 2 (two) times daily.    Meds ordered this encounter  Medications   Cetirizine HCl 10 MG CAPS    Sig: Take 1 capsule (10 mg total) by mouth daily for 10 days.    Dispense:  30 capsule    Refill:  11   DISCONTD: triamcinolone cream (KENALOG) 0.1 %    Sig: Apply 1 application topically 2 (two) times daily.    Dispense:  454 g    Refill:  1      This note  has been created with Education officer, environmental. Any transcriptional errors are unintentional.   Grayce Sessions, NP 10/20/2021, 4:26 AM

## 2021-10-15 NOTE — Patient Instructions (Addendum)
HPV Test Why am I having this test? HPV (human papillomavirus) refers to a group of over 150 viruses. Many of these viruses cause growths on the surfaces of the skin, including the genitals or along the throat. Most HPV viruses cause infections that usually go away without treatment. The HPV test checks for high-risk types (strains) of HPV. Strains 16 and 18 are considered the most high-risk for cancer. If you are infected with a high-risk strain of HPV, it can increase your risk for cancer of the cervix, vagina, vulva, anus, penis, or throat. HPV can be found in both males and females. However, HPV testing is only approved for use in females: Who are 30-65 years old. Who have an abnormal Pap test. Who have been treated for an abnormal Pap test in the past. Who have been treated for a high-risk HPV infection in the past. If you are a woman older than 30, you may have the HPV test at the same time as a pelvic exam and Pap test (cotesting). There is a high chance of finding HPV if HPV test, pelvic exam, and Pap test are done at the same time. What is being tested? This test checks for the DNA (genetic) strands of the HPV infection. This test is also called the HPV DNA test. What kind of sample is taken? This test requires a sample of cells from the cervix. This will be done using a small cotton swab, plastic spatula, or brush. This sample is often collected during a pelvic exam, when you are lying on your back on an exam table with feet in footrests (stirrups). How do I prepare for this test? Starting 24-48 hours before your test, or as told by your health care provider, do not: Take a bath. Have sex. Douche. Schedule the test for a day when you are not menstruating. If you are menstruating on the day of the test, you may need to reschedule. You will be asked to urinate right before the test. How are the results reported? Your test results will be reported as either positive or negative for  HPV. What do the results mean? A negative HPV test result means that no HPV was found. This means it is very likely that you do not have HPV. A positive HPV test result means that you have high-risk HPV that can lead to certain types of cancer. It does not mean that you have cancer. Talk with your health care provider about what your results mean. Questions to ask your health care provider Ask your health care provider, or the department that is doing the test: When will my results be ready? How will I get my results? What are my treatment options? What other tests do I need? What are my next steps? Summary The human papillomavirus (HPV) test is used to look for high-risk types of HPV infection. This test is done only for females. HPV types 16 and 18 are considered high-risk types of HPV. If untreated, these types of infections increase your risk for cancer of the cervix, vagina, vulva, anus, penis, or throat. A negative HPV test result means that no HPV was found, and it is very likely that you do not have HPV. A positive HPV test result means that you have an HPV infection. It does not mean that you have cancer. This information is not intended to replace advice given to you by your health care provider. Make sure you discuss any questions you have with your health care provider.   Document Revised: 06/26/2020 Document Reviewed: 06/26/2020 Elsevier Patient Education  2022 [employer]. Discussed eating small frequent meal, reduction in acidic foods, fried foods ,spicy foods, alcohol caffeine and tobacco and certain medications. Avoid laying down after eating 69mins-1hour, elevated head of the bed.   Hidradenitis Suppurativa Hidradenitis suppurativa is a long-term (chronic) skin disease. It is similar to a severe form of acne, but it affects areas of the body where acne would be unusual, especially areas of the body where skin rubs against skin and becomes moist. These  include: Underarms. Groin. Genital area. Buttocks. Upper thighs. Breasts. Hidradenitis suppurativa may start out as small lumps or pimples caused by blocked sweat glands or hair follicles. Pimples may develop into deep sores that break open (rupture) and drain pus. Over time, affected areas of skin may thicken and become scarred. This condition is rare and does not spread from person to person (non-contagious). What are the causes? The exact cause of this condition is not known. It may be related to: Female and female hormones. An overactive disease-fighting system (immune system). The immune system may over-react to blocked hair follicles or sweat glands and cause swelling and pus-filled sores. What increases the risk? You are more likely to develop this condition if you: Are female. Are 99-76 years old. Have a family history of hidradenitis suppurativa. Have a personal history of acne. Are overweight. Smoke. Take the medicine lithium. What are the signs or symptoms? The first symptoms are usually painful bumps in the skin, similar to pimples. The condition may get worse over time (progress), or it may only cause mild symptoms. If the disease progresses, symptoms may include: Skin bumps getting bigger and growing deeper into the skin. Bumps rupturing and draining pus. Itchy, infected skin. Skin getting thicker and scarred. Tunnels under the skin (fistulas) where pus drains from a bump. Pain during daily activities, such as pain during walking if your groin area is affected. Emotional problems, such as stress or depression. This condition may affect your appearance and your ability or willingness to wear certain clothes or do certain activities. How is this diagnosed? This condition is diagnosed by a health care provider who specializes in skin diseases (dermatologist). You may be diagnosed based on: Your symptoms and medical history. A physical exam. Testing a pus sample for  infection. Blood tests. How is this treated? Your treatment will depend on how severe your symptoms are. The same treatment will not work for everybody with this condition. You may need to try several treatments to find what works best for you. Treatment may include: Cleaning and bandaging (dressing) your wounds as needed. Lifestyle changes, such as new skin care routines. Taking medicines, such as: Antibiotics. Acne medicines. Medicines to reduce the activity of the immune system. A diabetes medicine (metformin). Birth control pills, for women. Steroids to reduce swelling and pain. Working with a mental health care provider, if you experience emotional distress due to this condition. If you have severe symptoms that do not get better with medicine, you may need surgery. Surgery may involve: Using a laser to clear the skin and remove hair follicles. Opening and draining deep sores. Removing the areas of skin that are diseased and scarred. Follow these instructions at home: Medicines  Take over-the-counter and prescription medicines only as told by your health care provider. If you were prescribed an antibiotic medicine, take it as told by your health care provider. Do not stop taking the antibiotic even if your condition improves. Skin care If  you have open wounds, cover them with a clean dressing as told by your health care provider. Keep wounds clean by washing them gently with soap and water when you bathe. Do not shave the areas where you get hidradenitis suppurativa. Do not wear deodorant. Wear loose-fitting clothes. Try to avoid getting overheated or sweaty. If you get sweaty or wet, change into clean, dry clothes as soon as you can. To help relieve pain and itchiness, cover sore areas with a warm, clean washcloth (warm compress) for 5-10 minutes as often as needed. If told by your health care provider, take a bleach bath twice a week: Fill your bathtub halfway with water. Pour  in  cup of unscented household bleach. Soak in the tub for 5-10 minutes. Only soak from the neck down. Avoid water on your face and hair. Shower to rinse off the bleach from your skin. General instructions Learn as much as you can about your disease so that you have an active role in your treatment. Work closely with your health care provider to find treatments that work for you. If you are overweight, work with your health care provider to lose weight as recommended. Do not use any products that contain nicotine or tobacco, such as cigarettes and e-cigarettes. If you need help quitting, ask your health care provider. If you struggle with living with this condition, talk with your health care provider or work with a mental health care provider as recommended. Keep all follow-up visits as told by your health care provider. This is important. Where to find more information Hidradenitis [place].: https://www.hs-foundation.org/ American Academy of Dermatology: http://www.nguyen-hutchinson.com/ Contact a health care provider if you have: A flare-up of hidradenitis suppurativa. A fever or chills. Trouble controlling your symptoms at home. Trouble doing your daily activities because of your symptoms. Trouble dealing with emotional problems related to your condition. Summary Hidradenitis suppurativa is a long-term (chronic) skin disease. It is similar to a severe form of acne, but it affects areas of the body where acne would be unusual. The first symptoms are usually painful bumps in the skin, similar to pimples. The condition may only cause mild symptoms, or it may get worse over time (progress). If you have open wounds, cover them with a clean dressing as told by your health care provider. Keep wounds clean by washing them gently with soap and water when you bathe. Besides skin care, treatment may include medicines, laser treatment, and surgery. This information is not intended to replace  advice given to you by your health care provider. Make sure you discuss any questions you have with your health care provider. Document Revised: 09/04/2020 Document Reviewed: 09/04/2020 Elsevier Patient Education  2022 [employer].

## 2021-10-16 ENCOUNTER — Other Ambulatory Visit (INDEPENDENT_AMBULATORY_CARE_PROVIDER_SITE_OTHER): Payer: Self-pay | Admitting: Primary Care

## 2021-10-16 ENCOUNTER — Telehealth (INDEPENDENT_AMBULATORY_CARE_PROVIDER_SITE_OTHER): Payer: Self-pay | Admitting: Primary Care

## 2021-10-16 MED ORDER — TRIAMCINOLONE ACETONIDE 0.1 % EX CREA: 1.0000 "application " | TOPICAL_CREAM | Freq: Two times a day (BID) | CUTANEOUS | 1 refills | Status: DC

## 2021-10-16 NOTE — Telephone Encounter (Signed)
Requested Prescriptions  Pending Prescriptions Disp Refills  . triamcinolone cream (KENALOG) 0.1 % 454 g 1    Sig: Apply 1 application topically 2 (two) times daily.     Dermatology:  Corticosteroids Passed - 10/16/2021  5:38 PM      Passed - Valid encounter within last 12 months    Recent Outpatient Visits          Yesterday Lower abdominal pain   [facility] Grayce Sessions, NP   8 months ago Encounter to establish care   [facility] Grayce Sessions, NP      Future Appointments            In 1 month Randa Evens, Kinnie Scales, NP [facility]

## 2021-10-16 NOTE — Telephone Encounter (Signed)
Left message informing patient that kenalog was sent to pharmacy.

## 2021-10-16 NOTE — Telephone Encounter (Signed)
Patient called back, she was given the message below from [place], [place]. She asked why is Kenalog better than Hydrocortisone. I advised it's a stronger cortisone cream that tends to work better than hydrocortisone, patient verbalized understanding. She says she has 2 questions: Will she be able to use the Kenalog cream on her face if the rash appears there? When will she hear from the dermatology referral that Marcelino Duster mentioned at the visit on yesterday? I advised to check with the pharmacist when she picks up the Kenalog cream about using it on the face. I advised the dermatology referral was not placed that I could see in the chart, advised to give it until Dec. 8th and if no response from dermatology to call us back and ask who the dermatologist will be and she will be able to call them to check on the referral. Patient verbalized understanding.

## 2021-10-16 NOTE — Telephone Encounter (Signed)
Pt called stating that she was seen in office yesterday. She states that she is not needing to cetrizine medication, but is requesting to have the Kenalog and Hydrocortisone cream sent to the pharmacy. Please advise.       [facility] [id number] - [place], [place] - [address]  [address] [place] [address]  Phone: [phone] Fax: [phone]  Hours: Not open 24 hours

## 2021-10-16 NOTE — Telephone Encounter (Signed)
Pt is thank Marcelino Duster for sending in the Kenalog. Pt would like to know why the Hydrocortisone cream was not sent to the pharmacy. Pt is is requesting Hydrocortisone 2.5. Pt has been on the hydrocortisone 2.5 through a different PCP in [place]. Please advise.   [employer] [address] [place], [place]. [address]  [phone]

## 2021-10-16 NOTE — Telephone Encounter (Signed)
Sent to PCP ?

## 2021-10-16 NOTE — Telephone Encounter (Signed)
Medication: triamcinolone cream (KENALOG) 0.1 % [[id number]]   Has the patient contacted their pharmacy? YES - Medication was sent to wrong pharmacy can it be sent to the below pharmacy  (Agent: If no, request that the patient contact the pharmacy for the refill. If patient does not wish to contact the pharmacy document the reason why and proceed with request.) (Agent: If yes, when and what did the pharmacy advise?)  Preferred Pharmacy (with phone number or street name): [facility] [id number] - [place], [place] - [address] [place] [address] Phone: [phone] Fax: [phone] Hours: Not open 24 hours   Has the patient been seen for an appointment in the last year OR does the patient have an upcoming appointment? YES 10/15/21  Agent: Please be advised that RX refills may take up to 3 business days. We ask that you follow-up with your pharmacy.

## 2021-10-16 NOTE — Telephone Encounter (Signed)
Left message informing patient that per PCP kenalog is best. Hydrocortisone not sent does not need both. Advised is closing for remainder of week and not open again until Monday. Call back with any concerns or questions.

## 2021-10-16 NOTE — Telephone Encounter (Signed)
Pt called in checking up on her previous, pt wanted a call back with an update. Please advise.

## 2021-10-20 MED ORDER — TRIAMCINOLONE ACETONIDE 0.1 % EX CREA: 1.0000 "application " | TOPICAL_CREAM | Freq: Two times a day (BID) | CUTANEOUS | 1 refills | Status: AC

## 2021-10-21 ENCOUNTER — Other Ambulatory Visit (INDEPENDENT_AMBULATORY_CARE_PROVIDER_SITE_OTHER): Payer: Self-pay | Admitting: Primary Care

## 2021-10-21 DIAGNOSIS — L732 Hidradenitis suppurativa: Secondary | ICD-10-CM

## 2021-10-21 DIAGNOSIS — L309 Dermatitis, unspecified: Secondary | ICD-10-CM

## 2021-10-21 NOTE — Telephone Encounter (Signed)
FYI. See notes below. Please place referral.

## 2021-10-21 NOTE — Telephone Encounter (Signed)
Sent derm referral for eczema and hidradenitis

## 2021-10-23 ENCOUNTER — Encounter (INDEPENDENT_AMBULATORY_CARE_PROVIDER_SITE_OTHER): Payer: Self-pay | Admitting: Primary Care

## 2021-12-09 ENCOUNTER — Encounter (INDEPENDENT_AMBULATORY_CARE_PROVIDER_SITE_OTHER): Payer: Self-pay | Admitting: Primary Care

## 2021-12-09 ENCOUNTER — Telehealth (INDEPENDENT_AMBULATORY_CARE_PROVIDER_SITE_OTHER): Payer: Commercial Managed Care - PPO | Admitting: Primary Care

## 2021-12-09 DIAGNOSIS — N920 Excessive and frequent menstruation with regular cycle: Secondary | ICD-10-CM

## 2021-12-09 MED ORDER — CETIRIZINE HCL 10 MG PO CAPS: 10.0000 mg | ORAL_CAPSULE | Freq: Every day | ORAL | 11 refills | Status: AC

## 2021-12-16 NOTE — Progress Notes (Signed)
[facility]      HPI    [name] 26 y.o.female presents here today for menorrhagia . Concerned she voiced was  heavy vaginal bleeding irregular menstrual cycles.  This may be related to hormonal problems, problems with the uterus or other health conditions.  Menstrual cycles can be normal or irregular. Patient has No headache, No chest pain, No abdominal pain - No Nausea, No new weakness tingling or numbness, No Cough - SOB.  Past Medical History:  Diagnosis Date   Allergies    Anxiety      Allergies  Allergen Reactions   Latex Hives   Avocado Hives   Current Outpatient Medications on File Prior to Visit  Medication Sig Dispense Refill   acetaminophen (TYLENOL) 650 MG CR tablet Take 650 mg by mouth every 8 (eight) hours as needed for pain.     ibuprofen (ADVIL) 800 MG tablet Take 800 mg by mouth every 8 (eight) hours as needed.     triamcinolone cream (KENALOG) 0.1 % Apply 1 application topically 2 (two) times daily. 454 g 1   No current facility-administered medications on file prior to visit.    ROS: all negative except above.   Physical Exam: General Appearance: Well nourished, in no apparent distress. Eyes: PERRLA, EOMs, conjunctiva no swelling or erythema Sinuses: No Frontal/maxillary tenderness ENT/Mouth: Ext aud canals clear, TMs without erythema, bulging. Neck: Supple, thyroid normal.  Respiratory: Respiratory effort normal, BS equal bilaterally without rales, rhonchi, wheezing or stridor.  Cardio: RRR with no MRGs. Brisk peripheral pulses without edema.  Abdomen: Soft, + BS.  Non tender, no guarding, rebound, hernias, masses. Lymphatics: Non tender without lymphadenopathy.  Musculoskeletal: Full ROM, 5/5 strength, normal gait.  Skin: Warm, dry without rashes, lesions, ecchymosis.  Neuro: Cranial nerves intact. Normal muscle tone, no cerebellar symptoms. Sensation intact.  Psych: Awake and oriented X 3, normal affect, Insight and  Judgment appropriate.    Diagnoses and all orders for this visit:  Menorrhagia with regular cycle -     CBC with Differential/Platelet; Future  Hypocalcemia -     CBC with Differential/Platelet; Future -     CMP14+EGFR; Future -     Vitamin D, 25-hydroxy; Future  Other orders -     Cetirizine HCl 10 MG CAPS; Take 1 capsule (10 mg total) by mouth daily for 10 days.     Kerin Perna, NP 8:49 AM

## 2021-12-23 ENCOUNTER — Other Ambulatory Visit (INDEPENDENT_AMBULATORY_CARE_PROVIDER_SITE_OTHER): Payer: Self-pay | Admitting: Primary Care

## 2021-12-24 NOTE — Telephone Encounter (Signed)
Pt called saying she would like to know why the medications were denied.

## 2022-02-06 ENCOUNTER — Other Ambulatory Visit: Payer: Self-pay

## 2022-02-06 ENCOUNTER — Ambulatory Visit (INDEPENDENT_AMBULATORY_CARE_PROVIDER_SITE_OTHER): Payer: Managed Care, Other (non HMO) | Admitting: Orthopaedic Surgery

## 2022-02-06 ENCOUNTER — Encounter: Payer: Self-pay | Admitting: Orthopaedic Surgery

## 2022-02-06 DIAGNOSIS — M545 Low back pain, unspecified: Secondary | ICD-10-CM | POA: Diagnosis not present

## 2022-02-06 DIAGNOSIS — G8929 Other chronic pain: Secondary | ICD-10-CM | POA: Diagnosis not present

## 2022-02-06 NOTE — Progress Notes (Signed)
? ?  Office Visit Note ?  ?Patient: Jamie Valenzuela           ?Date of Birth: [birth year]           ?MRN: [id number] ?Visit Date: 02/06/2022 ?             ?Requested by: Grayce Sessions, NP ?[id number] ?[place],  [place] [address] ?PCP: Grayce Sessions, NP ? ? ?Assessment & Plan: ?Visit Diagnoses:  ?1. Chronic low back pain without sciatica, unspecified back pain laterality   ? ? ?Plan: Impression is chronic low back pain from underlying scoliosis.  At this point, I have discussed referral to physical therapy for which she is agreeable to.  She would also like to meet with a scoliosis expert here in town.  We will make referral.  Follow-up with [place] as needed. ? ?Follow-Up Instructions: Return if symptoms worsen or fail to improve.  ? ?Orders:  ?Orders Placed This Encounter  ?Procedures  ? Ambulatory referral to Physical Therapy  ? ?No orders of the defined types were placed in this encounter. ? ? ? ? Procedures: ?No procedures performed ? ? ?Clinical Data: ?No additional findings. ? ? ?Subjective: ?Chief Complaint  ?Patient presents with  ? Scoliosis  ? ? ?HPI patient is a pleasant 26 year old female who comes in today with chronic low back pain.  She does have a history of underlying scoliosis with thoracic Cobb angle of 37 degrees and lumbar Cobb angle of 45 degrees.  She has previously been managed in [facility] where she grew up.  She spent a lot of time in a brace and was told she did not need surgery.  She continues to have mild pain to the lumbar spine.  No radiation down either leg.  Symptoms are worse with sleeping and stress.  She denies any paresthesias down either lower extremity.  Her main issue is wanting to make sure she corrects her posture.  She has not been to physical therapy. ? ?Review of Systems as detailed in HPI.  All others reviewed and are negative. ? ? ?Objective: ?Vital Signs: There were no vitals taken for this visit. ? ?Physical Exam well-developed well-nourished female no  acute distress.  Alert and oriented x3. ? ?Ortho Exam examination of the lumbar spine reveals mild tenderness to the lower thoracic spine.  No other spinous or paraspinous tenderness.  Negative straight leg raise.  No focal weakness.  She is neurovascular intact distally. ? ?Specialty Comments:  ?No specialty comments available. ? ?Imaging: ?No new imaging ? ? ?PMFS History: ?There are no problems to display for this patient. ? ?Past Medical History:  ?Diagnosis Date  ? Allergies   ? Anxiety   ?  ?History reviewed. No pertinent family history.  ?Past Surgical History:  ?Procedure Laterality Date  ? TYMPANOSTOMY TUBE PLACEMENT    ? UTERINE FIBROID SURGERY    ? WISDOM TOOTH EXTRACTION    ? ?Social History  ? ?Occupational History  ? Not on file  ?Tobacco Use  ? Smoking status: Never  ? Smokeless tobacco: Never  ?Substance and Sexual Activity  ? Alcohol use: Not Currently  ? Drug use: Never  ? Sexual activity: Yes  ?  Partners: Male  ? ? ? ? ? ? ?

## 2022-02-10 ENCOUNTER — Ambulatory Visit (INDEPENDENT_AMBULATORY_CARE_PROVIDER_SITE_OTHER): Payer: Self-pay

## 2022-02-10 NOTE — Telephone Encounter (Signed)
Pt requesting return call from a nurse to explain the tests that were ordered. Cb# [id number]   ?  ?Pt. Needed appointment for lab work. Appointment made. ?Answer Assessment - Initial Assessment Questions ?1. REASON FOR CALL or QUESTION: "What is your reason for calling today?" or "How can I best help you?" or "What question do you have that I can help answer?" ?    Needed appointment for lab work. ? ?Protocols used: Information Only Call - No Triage-A-AH ? ?

## 2022-02-12 ENCOUNTER — Other Ambulatory Visit: Payer: Managed Care, Other (non HMO)

## 2022-02-21 NOTE — Therapy (Deleted)
?OUTPATIENT PHYSICAL THERAPY THORACOLUMBAR EVALUATION ? ? ?Patient Name: Jamie Valenzuela ?MRN: [id number] ?DOB:02-24-1996, 26 y.o., female ?Today's Date: 02/21/2022 ? ? ? ?Past Medical History:  ?Diagnosis Date  ? Allergies   ? Anxiety   ? ?Past Surgical History:  ?Procedure Laterality Date  ? TYMPANOSTOMY TUBE PLACEMENT    ? UTERINE FIBROID SURGERY    ? WISDOM TOOTH EXTRACTION    ? ?There are no problems to display for this patient. ? ? ?PCP: Grayce Sessions, NP ? ?REFERRING PROVIDER: Cristie Hem, PA-C ? ?REFERRING DIAG: M54.50,G89.29 (ICD-10-CM) - Chronic low back pain without sciatica, unspecified back pain laterality ? ?THERAPY DIAG:  ?No diagnosis found. ? ?ONSET DATE: Chronic  ? ?SUBJECTIVE:                                                                                                                                                                                          ? ?SUBJECTIVE STATEMENT: ?She has back pain and scoliosis, previously been managed in [facility] where she grew up.  She spent a lot of time in a brace and was told she did not need surgery.  She continues to have mild pain to the lumbar spine.  No radiation down either leg.  Symptoms are worse with sleeping and stress.  She denies any paresthesias down either lower extremity.  Her main issue is wanting to make sure she corrects her posture ?PERTINENT HISTORY:  ?scoliosis with thoracic Cobb angle of 37 degrees and lumbar Cobb angle of 45 degrees. ? ?PAIN:  ?Are you having pain? Yes: {yespain:27235::"NPRS scale: ***/10","Pain location: ***","Pain description: ***","Aggravating factors: ***","Relieving factors: ***"} ? ? ?PRECAUTIONS: {Therapy precautions:24002} ? ?WEIGHT BEARING RESTRICTIONS {Yes ***/No:24003} ? ?FALLS:  ?Has patient fallen in last 6 months? {fallsyesno:27318} ? ?LIVING ENVIRONMENT: ?Lives with: {[facility] lives with:25569::"lives with their family"} ?Lives in: {Lives in:25570} ?Stairs: {opstairs:27293} ?Has  following equipment at home: {Assistive devices:23999} ? ?OCCUPATION: *** ? ?PLOF: {PLOF:24004} ? ?PATIENT GOALS *** ? ? ?OBJECTIVE:  ? ?DIAGNOSTIC FINDINGS:  ?*** ? ?PATIENT SURVEYS:  ?{rehab surveys:24030} ? ?SCREENING FOR RED FLAGS: ?Bowel or bladder incontinence: {Yes/No:304960894} ?Spinal tumors: {Yes/No:304960894} ?Cauda equina syndrome: {Yes/No:304960894} ?Compression fracture: {Yes/No:304960894} ?Abdominal aneurysm: {Yes/No:304960894} ? ?COGNITION: ? Overall cognitive status: {cognition:24006}   ?  ?SENSATION: ?{sensation:27233} ? ?MUSCLE LENGTH: ?Hamstrings: Right *** deg; Left *** deg ?Thomas test: Right *** deg; Left *** deg ? ?POSTURE:  ?*** ? ?PALPATION: ?*** ? ?LUMBAR ROM:  ? ?{AROM/PROM:27142}  A/PROM  ?02/21/2022  ?Flexion   ?Extension   ?Right lateral flexion   ?Left lateral flexion   ?Right rotation   ?Left rotation   ? (Blank rows =  not tested) ? ?LE ROM: ? ?{AROM/PROM:27142}  Right ?02/21/2022 Left ?02/21/2022  ?Hip flexion    ?Hip extension    ?Hip abduction    ?Hip adduction    ?Hip internal rotation    ?Hip external rotation    ?Knee flexion    ?Knee extension    ?Ankle dorsiflexion    ?Ankle plantarflexion    ?Ankle inversion    ?Ankle eversion    ? (Blank rows = not tested) ? ?LE MMT: ? ?MMT Right ?02/21/2022 Left ?02/21/2022  ?Hip flexion    ?Hip extension    ?Hip abduction    ?Hip adduction    ?Hip internal rotation    ?Hip external rotation    ?Knee flexion    ?Knee extension    ?Ankle dorsiflexion    ?Ankle plantarflexion    ?Ankle inversion    ?Ankle eversion    ? (Blank rows = not tested) ? ?LUMBAR SPECIAL TESTS:  ?{lumbar special test:25242} ? ?FUNCTIONAL TESTS:  ?{Functional tests:24029} ? ?GAIT: ?Distance walked: *** ?Assistive device utilized: {Assistive devices:23999} ?Level of assistance: {Levels of assistance:24026} ?Comments: *** ? ? ? ?TODAY'S TREATMENT  ?*** ? ? ?PATIENT EDUCATION:  ?Education details: *** ?Person educated: {Person educated:25204} ?Education method: {Education  Method:25205} ?Education comprehension: {Education Comprehension:25206} ? ? ?HOME EXERCISE PROGRAM: ?*** ? ? ?ASSESSMENT: ? ?CLINICAL IMPRESSION: Patient presents with signs and symptoms consistent with ***. Patient will benefit from skilled PT to address below impairments and improve overall function. ? ?OBJECTIVE IMPAIRMENTS: decreased activity tolerance, difficulty walking, decreased balance, decreased endurance, decreased mobility, decreased ROM, decreased strength, impaired flexibility, impaired UE/LE use, postural dysfunction, and pain. ? ?ACTIVITY LIMITATIONS: bending, lifting, carry, locomotion, cleaning, community activity, driving, and or occupation ? ?PERSONAL FACTORS: OA, anx, dep, chronic pain, DM, are also affecting patient's functional outcome. ? ? ?REHAB POTENTIAL: Good ? ?CLINICAL DECISION MAKING: stable/uncomplicated, evolving/moderate, unstable/unpredictable ? ?EVALUATION COMPLEXITY: Low, moderate, High ? ? ? ?GOALS: ?Short term PT Goals Target date: {follow up:25551} ?Pt will be I and compliant with HEP. ?Baseline:  ?Goal status: New ?Pt will decrease pain by 25% overall ?Baseline: ?Goal status: New ? ?Long term PT goals Target date: {follow up:25551} ?Pt will improve ROM to [facility] to improve functional mobility ?Baseline: ?Goal status: New ?Pt will improve  hip/knee strength to at least 5-/5 MMT to improve functional strength ?Baseline: ?Goal status: New ?Pt will improve FOTO to at least % functional to show improved function ?Baseline: ?Goal status: New ?Pt will reduce pain by overall 50% overall with usual activity ?Baseline: ?Goal status: New ?Pt will reduce pain to overall less than 2-3/10 with usual activity and work activity. ?Baseline: ?Goal status: New ?Pt will be able to ambulate community distances at least 1000 ft WNL gait pattern without complaints ?Baseline: ?Goal status: New ? ?PLAN: ?PT FREQUENCY: 1-2 times per week  ? ?PT DURATION: 6-8 weeks ? ?PLANNED INTERVENTIONS (unless  contraindicated): aquatic PT, Canalith repositioning, cryotherapy, Electrical stimulation, Iontophoresis with 4 mg/ml dexamethasome, Moist heat, traction, Ultrasound, gait training, Therapeutic exercise, balance training, neuromuscular re-education, patient/family education, prosthetic training, manual techniques, passive ROM, dry needling, taping, vasopnuematic device, vestibular, spinal manipulations, joint manipulations ? ?PLAN FOR NEXT SESSION: *** ? ? ?April Manson, PT ?02/21/2022, 10:54 AM ? ?

## 2022-02-24 ENCOUNTER — Ambulatory Visit: Payer: Managed Care, Other (non HMO) | Admitting: Physical Therapy

## 2022-03-18 ENCOUNTER — Encounter: Payer: 59 | Admitting: Rehabilitative and Restorative Service Providers"

## 2022-03-18 ENCOUNTER — Other Ambulatory Visit: Payer: Self-pay

## 2022-03-18 ENCOUNTER — Encounter: Payer: Managed Care, Other (non HMO) | Admitting: Physical Therapy

## 2022-03-18 ENCOUNTER — Encounter: Payer: Self-pay | Admitting: Rehabilitative and Restorative Service Providers"

## 2022-03-18 NOTE — Progress Notes (Signed)
This encounter was created in error - please disregard.

## 2022-05-07 ENCOUNTER — Encounter: Payer: Self-pay | Admitting: Physician Assistant

## 2022-05-07 ENCOUNTER — Ambulatory Visit (INDEPENDENT_AMBULATORY_CARE_PROVIDER_SITE_OTHER): Payer: Managed Care, Other (non HMO) | Admitting: Physician Assistant

## 2022-05-07 DIAGNOSIS — L700 Acne vulgaris: Secondary | ICD-10-CM | POA: Diagnosis not present

## 2022-05-07 MED ORDER — CLINDAMYCIN PHOSPHATE 1 % EX GEL: Freq: Every day | CUTANEOUS | 3 refills | Status: AC

## 2022-05-07 MED ORDER — TRETINOIN 0.025 % EX CREA: TOPICAL_CREAM | Freq: Every day | CUTANEOUS | 3 refills | Status: AC

## 2022-05-07 NOTE — Progress Notes (Signed)
   New Patient   Subjective  Jamie Valenzuela is a 26 y.o. female who presents for the following: Skin Problem (Skin darkness where she has had previous eczema stress induced per patient this has now went away tx hydrocortisone ). She has small bumps on her face that change in severity significantly. She has used Adapalene, benzoyl peroxide, triamcinalone cream,  retin A and hydrocortisone.    The following portions of the chart were reviewed this encounter and updated as appropriate:  Tobacco  Allergies  Meds  Problems  Med Hx  Surg Hx  Fam Hx      Objective  Well appearing patient in no apparent distress; mood and affect are within normal limits.  All skin waist up examined.  Head - Anterior (Face) Erythematous papules and pustules    Assessment & Plan  Acne vulgaris Head - Anterior (Face)  Anaerobic and Aerobic Culture - Head - Anterior (Face)  clindamycin (CLINDAGEL) 1 % gel - Head - Anterior (Face) Apply topically daily.  tretinoin (RETIN-A) 0.025 % cream - Head - Anterior (Face) Apply topically at bedtime.     I, Sayre Mazor, PA-C, have reviewed all documentation's for this visit.  The documentation on 05/07/22 for the exam, diagnosis, procedures and orders are all accurate and complete.

## 2022-05-12 ENCOUNTER — Telehealth: Payer: Self-pay | Admitting: Physician Assistant

## 2022-05-12 NOTE — Telephone Encounter (Signed)
Has  questions about clindamycin Rx: 1)is it only supposed to be put on face at night or how often and 2) can she still use OTC stuff (rapid clear) that has benzol peroxide 3) how much is she supposed to use?

## 2022-05-12 NOTE — Telephone Encounter (Signed)
Phone call to patient to clarify how she's suppose to use the clindamycin gel. Patient states that she understands.

## 2022-05-15 ENCOUNTER — Telehealth: Payer: Self-pay

## 2022-05-15 LAB — ANAEROBIC AND AEROBIC CULTURE
MICRO NUMBER:: 13530175
MICRO NUMBER:: 13530176
SPECIMEN QUALITY:: ADEQUATE
SPECIMEN QUALITY:: ADEQUATE

## 2022-05-15 NOTE — Telephone Encounter (Signed)
Phone call to patient with her bacterial culture results. Patient aware of results.
